# Patient Record
Sex: Male | Born: 1955 | Race: White | Hispanic: No | Marital: Married | Smoking: Current every day smoker
Health system: Southern US, Community
[De-identification: ages and names within clinical notes are randomized; demographics above are authoritative.]

## PROBLEM LIST (undated history)

## (undated) DIAGNOSIS — I10 Essential (primary) hypertension: Secondary | ICD-10-CM

## (undated) HISTORY — DX: Essential (primary) hypertension: I10

## (undated) HISTORY — PX: KNEE ARTHROCENTESIS: SUR44

## (undated) HISTORY — PX: SHOULDER SURGERY: SHX246

## (undated) HISTORY — PX: THROAT SURGERY: SHX803

---

## 2010-03-03 ENCOUNTER — Ambulatory Visit: Payer: Self-pay | Admitting: Family Medicine

## 2010-03-03 DIAGNOSIS — H8110 Benign paroxysmal vertigo, unspecified ear: Secondary | ICD-10-CM

## 2010-03-03 HISTORY — DX: Benign paroxysmal vertigo, unspecified ear: H81.10

## 2010-08-05 NOTE — Assessment & Plan Note (Signed)
Summary: DIZZY (4)   Vital Signs:  Patient Profile:   55 Years Old Male CC:      Dizzy x today Height:     73 inches Weight:      221 pounds O2 Sat:      99 % O2 treatment:    Room Air Temp:     98.5 degrees F oral Pulse rate:   58 / minute Pulse (ortho):   58 / minute Resp:     14 per minute BP sitting:   134 / 84  (left arm) BP standing:   139 / 88 Cuff size:   large  Pt. in pain?   no  Vitals Entered By: Lajean Saver RN (March 03, 2010 8:41 AM)                  Serial Vital Signs/Assessments:  Time      Position  BP       Pulse  Resp  Temp     By 8:54 AM   Lying RA  127/82   58                    Lajean Saver RN 8:54 AM   Sitting   131/86   56                    Lajean Saver RN 8:54 AM   Standing  139/88   58                    Lajean Saver RN   Updated Prior Medication List: No Medications Current Allergies: No known allergies History of Present Illness Chief Complaint: Dizzy x today History of Present Illness:  Subjective:  Patient complains of awakening about a week ago with mild dizziness that lasted all day, but resolved spontaneously.  Today he again awoke with mild dizziness that is worse with movement and when he is supine.  He describes a sensation of head spinning and feeling off balance.  No headache, vision changes, or other neuro symptoms.  No fevers, chills, and sweats.  No earache.  No recent respiratory symptoms.  No nausea/vomiting   REVIEW OF SYSTEMS Constitutional Symptoms      Denies fever, chills, night sweats, weight loss, weight gain, and fatigue.  Eyes       Denies change in vision, eye pain, eye discharge, glasses, contact lenses, and eye surgery. Ear/Nose/Throat/Mouth       Complains of dizziness.      Denies hearing loss/aids, change in hearing, ear pain, ear discharge, frequent runny nose, frequent nose bleeds, sinus problems, sore throat, hoarseness, and tooth pain or bleeding.  Respiratory       Denies dry cough,  productive cough, wheezing, shortness of breath, asthma, bronchitis, and emphysema/COPD.  Cardiovascular       Denies murmurs, chest pain, and tires easily with exhertion.    Gastrointestinal       Denies stomach pain, nausea/vomiting, diarrhea, constipation, blood in bowel movements, and indigestion. Genitourniary       Denies painful urination, kidney stones, and loss of urinary control. Neurological       Denies paralysis, seizures, and fainting/blackouts. Musculoskeletal       Denies muscle pain, joint pain, joint stiffness, decreased range of motion, redness, swelling, muscle weakness, and gout.  Skin       Denies bruising, unusual mles/lumps or sores, and hair/skin or nail changes.  Psych  Denies mood changes, temper/anger issues, anxiety/stress, speech problems, depression, and sleep problems. Other Comments: Patient c/o dizziness x today and once last week. He is dizzy while sitting still but it is more prominant when changing positions.   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  Extraocular movement is intact.  Conjunctivae are not inflamed.  Fundi benign.  There is very mild nystagmus on lateral gaze. Ears:  Canals normal.  Tympanic membranes normal.   Nose:  No sinus congestion or sinus tenderness Mouth:  Tongue midline Pharynx:  Normal  Neck:  Supple.  No adenopathy is present.  No thyromegaly is present.  Carotids normal without bruits Lungs:  Clear to auscultation.  Breath sounds are equal.  Heart:  Regular rate and rhythm without murmurs, rubs, or  gallops.  Abdomen:  Nontender without masses or hepatosplenomegaly.  Bowel sounds are present.  No CVA or flank tenderness.  Neurologic:  Cranial nerves 2 through 12 are normal.  Patellar, achilles, and elbow reflexes are normal.  Cerebellar function is intact.  Gait and station are normal.    Assessment New Problems: BENIGN POSITIONAL VERTIGO (ICD-386.11)   Plan New Medications/Changes: MECLIZINE HCL 25 MG TABS (MECLIZINE HCL) One by mouth two times a day to three times a day as needed for dizziness  #15 x 1, 03/03/2010, Donna Christen MD  New Orders: New Patient Level III [phone] Planning Comments:   Begin Antivert. Follow-up with PCP if not improving one week or if symptoms worsen.   The patient and/or caregiver has been counseled thoroughly with regard to medications prescribed including dosage, schedule, interactions, rationale for use, and possible side effects and they verbalize understanding.  Diagnoses and expected course of recovery discussed and will return if not improved as expected or if the condition worsens. Patient and/or caregiver verbalized understanding.  Prescriptions: MECLIZINE HCL 25 MG TABS (MECLIZINE HCL) One by mouth two times a day to three times a day as needed for dizziness  #15 x 1   Entered and Authorized by:   Donna Christen MD   Signed by:   Donna Christen MD on 03/03/2010   Method used:   Print then Give to Patient   RxID:   [id number]   Orders Added: 1)  New Patient Level III [95621]  Appended Document: DIZZY (4) F/U call to pt - Courtesy call mess left on VM.

## 2011-07-26 ENCOUNTER — Emergency Department: Admit: 2011-07-26 | Discharge: 2011-07-26 | Disposition: A | Discharge status: Home / Self Care | Payer: 59

## 2011-07-26 ENCOUNTER — Emergency Department
Admission: EM | Admit: 2011-07-26 | Discharge: 2011-07-26 | Disposition: A | Discharge status: Home / Self Care | Payer: 59 | Source: Home / Self Care | Attending: Emergency Medicine | Admitting: Emergency Medicine

## 2011-07-26 ENCOUNTER — Encounter: Payer: Self-pay | Admitting: Emergency Medicine

## 2011-07-26 DIAGNOSIS — J209 Acute bronchitis, unspecified: Secondary | ICD-10-CM

## 2011-07-26 DIAGNOSIS — R05 Cough: Secondary | ICD-10-CM

## 2011-07-26 MED ORDER — AZITHROMYCIN 250 MG PO TABS: ORAL_TABLET | ORAL | Status: AC

## 2011-07-26 MED ORDER — PREDNISONE (PAK) 10 MG PO TABS: 10.0000 mg | ORAL_TABLET | Freq: Every day | ORAL | Status: AC

## 2011-07-26 NOTE — ED Notes (Signed)
Coughing productive x 1 week. No Flu vaccination this season.

## 2011-07-26 NOTE — ED Provider Notes (Signed)
History     CSN: [id number]  Arrival date & time 07/26/11  1244   First MD Initiated Contact with Patient 07/26/11 1350      Chief Complaint  Patient presents with  . Cough    (Consider location/radiation/quality/duration/timing/severity/associated sxs/prior treatment) HPI Benjamin Melton is a 56 y.o. male who complains of onset of cold symptoms for 5-7 days. He has been spitting up some brown phlegm and his wife who is a nurse advised that he have a chest x-ray to rule out pneumonia. Other than the cough and chest tightness he states he feels completely fine. No sore throat + mild cough No pleuritic pain No wheezing No nasal congestion + post-nasal drainage No sinus pain/pressure No chest congestion No itchy/red eyes No earache No hemoptysis No SOB No chills/sweats No fever No nausea No vomiting No abdominal pain No diarrhea No skin rashes No fatigue No myalgias No headache    History reviewed. No pertinent past medical history.  Past Surgical History  Procedure Date  . Throat surgery   . Knee arthrocentesis     History reviewed. No pertinent family history.  History  Substance Use Topics  . Smoking status: Current Everyday Smoker  . Smokeless tobacco: Not on file  . Alcohol Use: No      Review of Systems  Allergies  Review of patient's allergies indicates no known allergies.  Home Medications  No current outpatient prescriptions on file.  BP 144/98  Pulse 66  Temp(Src) 98.9 F (37.2 C) (Oral)  Resp 18  Ht 6' 1.5" (1.867 m)  Wt 215 lb (97.523 kg)  BMI 27.98 kg/m2  SpO2 98%  Physical Exam  Nursing note and vitals reviewed. Constitutional: He is oriented to person, place, and time. He appears well-developed and well-nourished.  HENT:  Head: Normocephalic and atraumatic.  Right Ear: Tympanic membrane, external ear and ear canal normal.  Left Ear: Tympanic membrane, external ear and ear canal normal.  Nose: Nose normal.  Mouth/Throat: No  oropharyngeal exudate or posterior oropharyngeal edema.  Eyes: No scleral icterus.  Neck: Neck supple.  Cardiovascular: Regular rhythm and normal heart sounds.   Pulmonary/Chest: Effort normal. No respiratory distress. He has wheezes in the right lower field. He has rhonchi in the right lower field.  Neurological: He is alert and oriented to person, place, and time.  Skin: Skin is warm and dry.  Psychiatric: He has a normal mood and affect. His speech is normal.    ED Course  Procedures (including critical care time)  Labs Reviewed - No data to display Dg Chest 2 View  07/26/2011  *RADIOLOGY REPORT*  Clinical Data: Cough  CHEST - 2 VIEW  Comparison: None.  Findings: Mild scarring versus atelectasis in the right lower lobe. No pleural effusion or pneumothorax.  Cardiomediastinal silhouette is within normal limits.  Mild degenerative changes of the visualized thoracolumbar spine.  IMPRESSION: No evidence of acute cardiopulmonary disease.  Original Report Authenticated By: Charline Bills, M.D.     1. Cough   2. Acute bronchitis       MDM  1)  Take the prescribed antibiotic as instructed.  No more smoking! 2)  Use nasal saline solution (over the counter) at least 3 times a day. 3)  Use over the counter decongestants like Zyrtec-D every 12 hours as needed to help with congestion.  If you have hypertension, do not take medicines with sudafed.  4)  Can take tylenol every 6 hours or motrin every 8 hours for pain or  fever. 5)  Follow up with your primary doctor if no improvement in 5-7 days, sooner if increasing pain, fever, or new symptoms.     Lily Kocher, MD 07/26/11 [phone]

## 2011-07-27 ENCOUNTER — Emergency Department: Admission: EM | Admit: 2011-07-27 | Discharge: 2011-07-27 | Discharge status: Absent without leave | Payer: Self-pay

## 2012-09-03 IMAGING — CR DG CHEST 2V
2 series · 2 of 2 positions shown · non-contrast
Comparison: None.

CLINICAL DATA: Cough

CHEST - 2 VIEW

[view not recorded (1 of 2)]
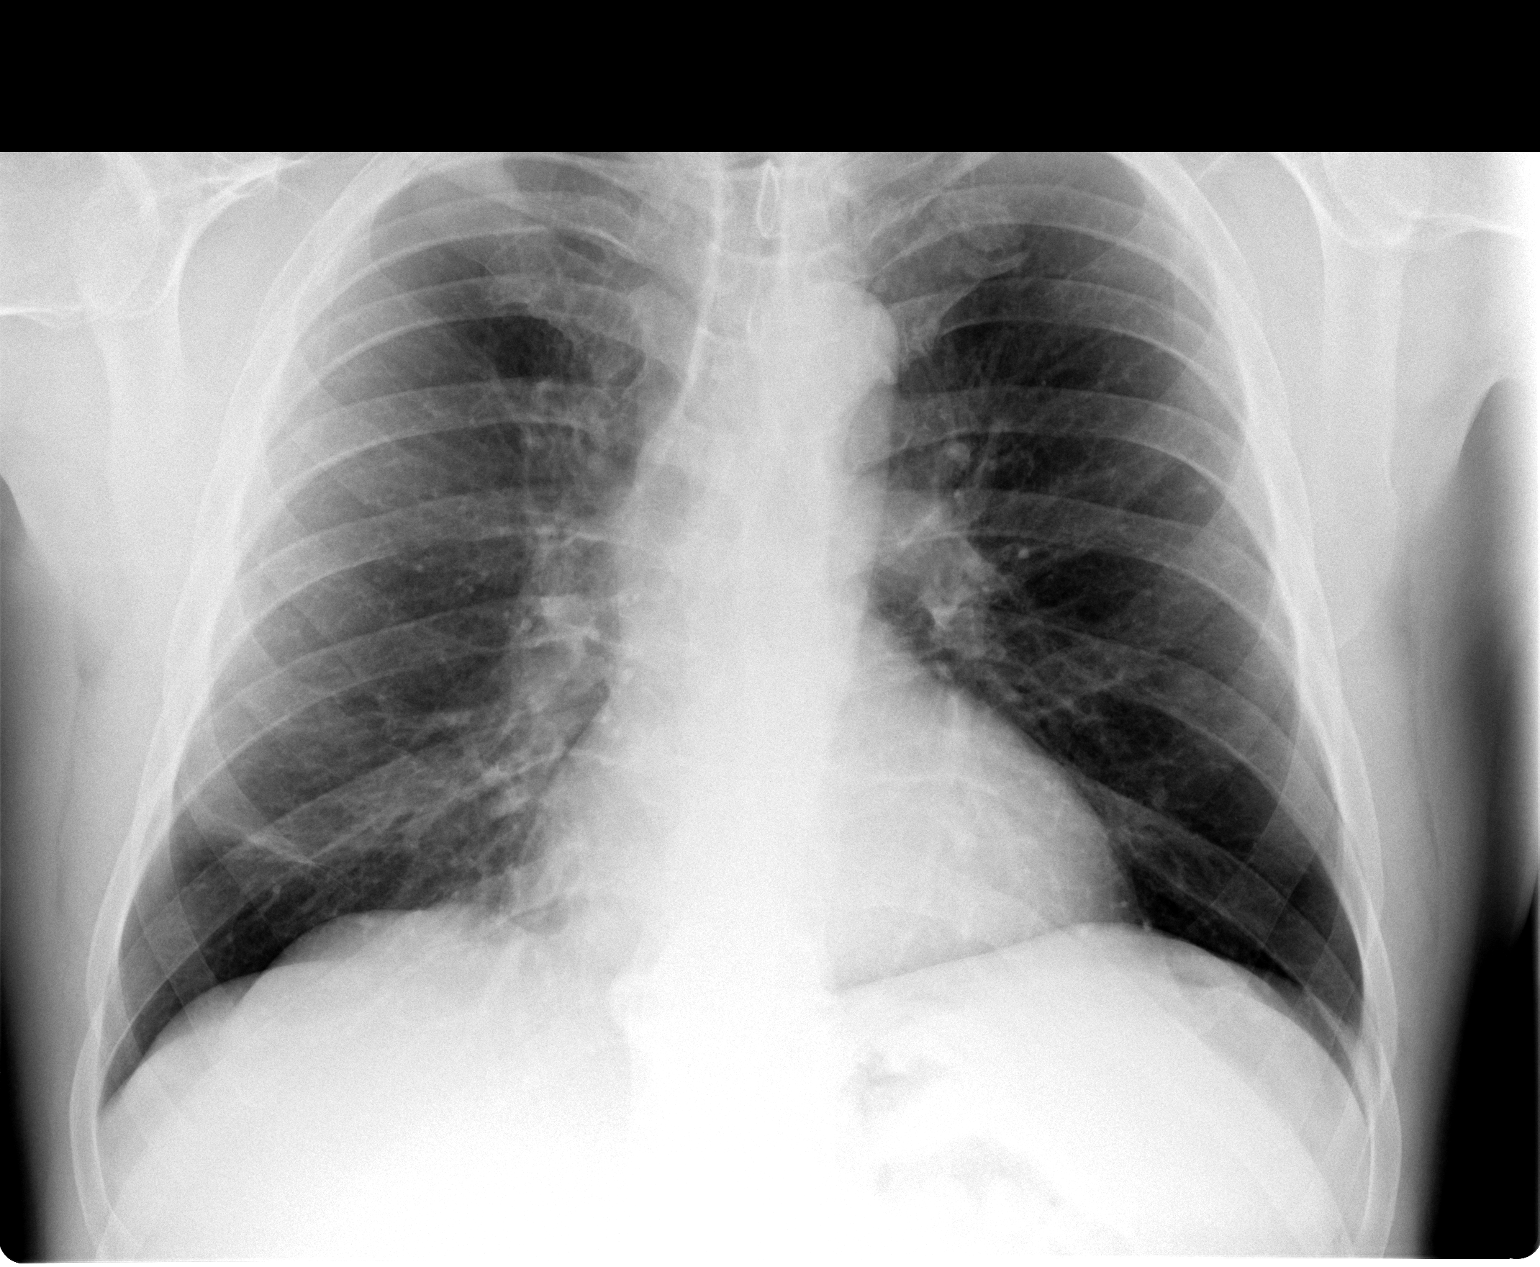

[view not recorded (2 of 2)]
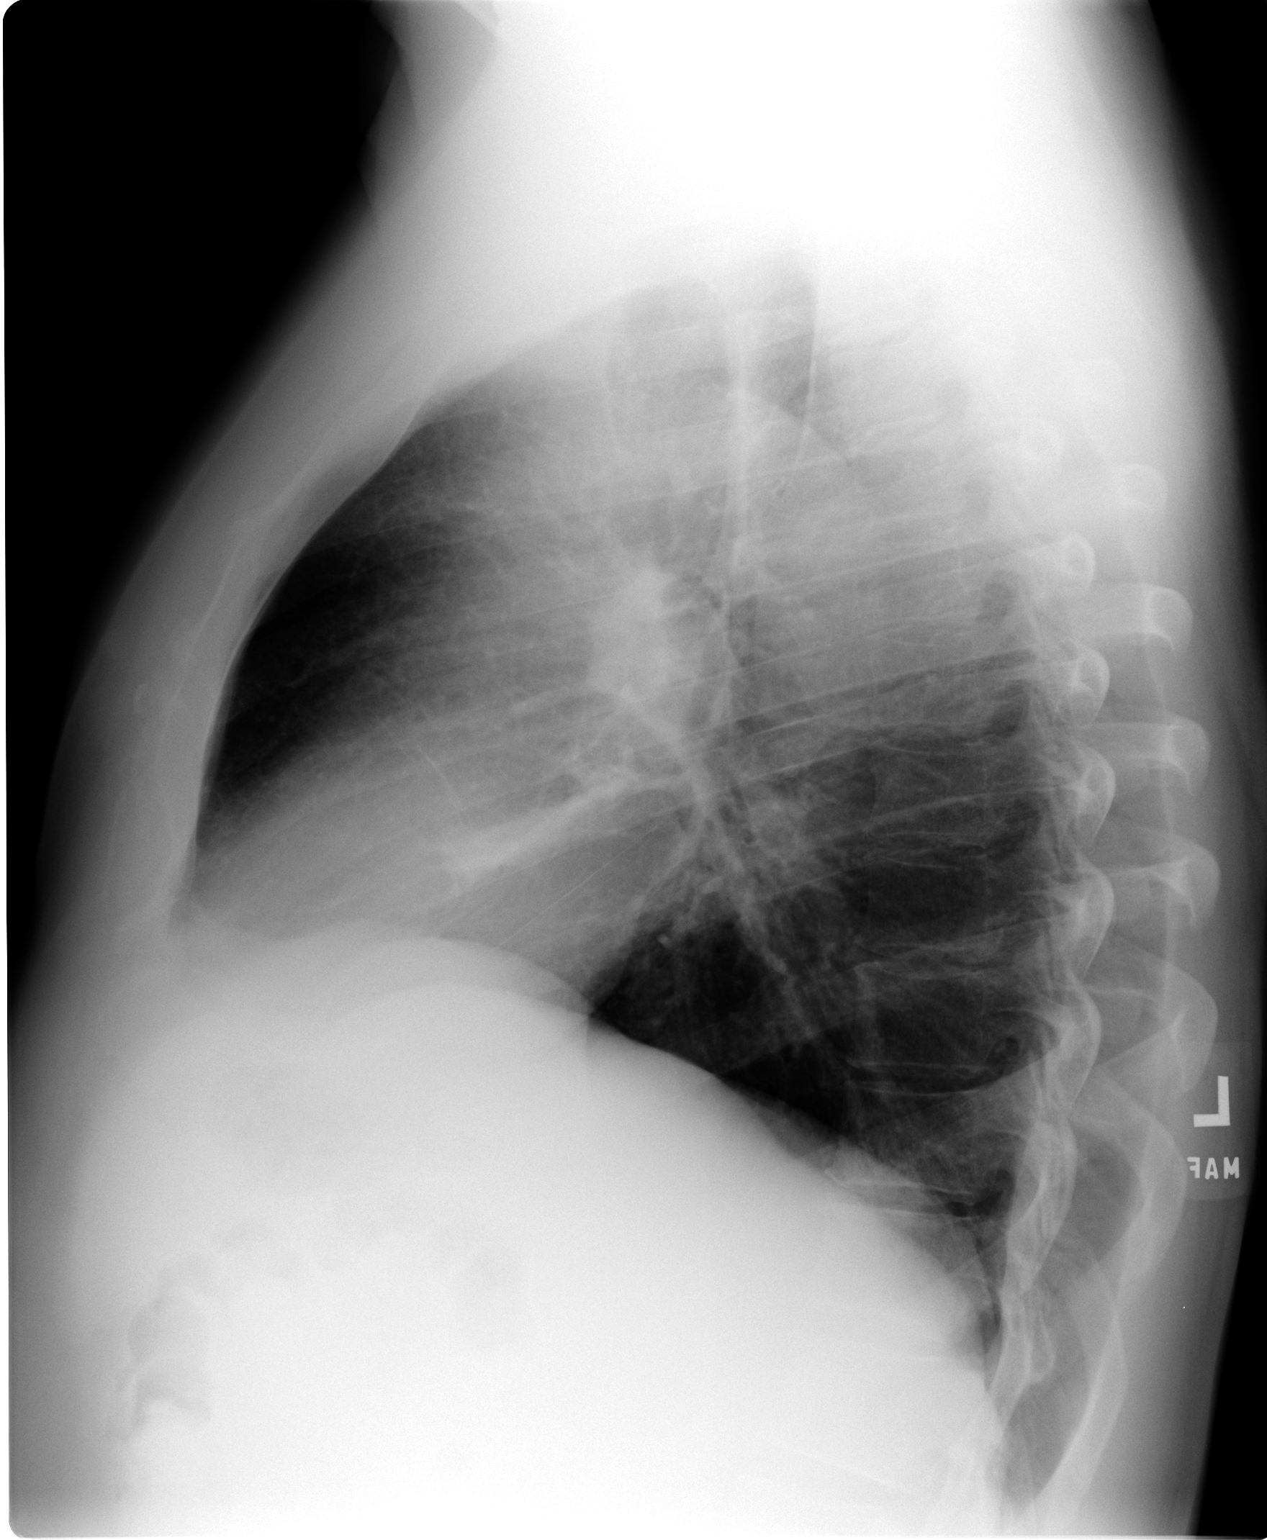

[2 of 2 positions shown; findings below may reference images not displayed]

FINDINGS: Mild scarring versus atelectasis in the right lower lobe.
No pleural effusion or pneumothorax.

Cardiomediastinal silhouette is within normal limits.

Mild degenerative changes of the visualized thoracolumbar spine.
IMPRESSION: No evidence of acute cardiopulmonary disease.

## 2018-10-26 DIAGNOSIS — S46012A Strain of muscle(s) and tendon(s) of the rotator cuff of left shoulder, initial encounter: Secondary | ICD-10-CM

## 2018-10-26 DIAGNOSIS — S46219A Strain of muscle, fascia and tendon of other parts of biceps, unspecified arm, initial encounter: Secondary | ICD-10-CM

## 2018-10-26 DIAGNOSIS — M7542 Impingement syndrome of left shoulder: Secondary | ICD-10-CM

## 2018-10-26 HISTORY — DX: Strain of muscle, fascia and tendon of other parts of biceps, unspecified arm, initial encounter: S46.219A

## 2018-10-26 HISTORY — DX: Strain of muscle(s) and tendon(s) of the rotator cuff of left shoulder, initial encounter: S46.012A

## 2018-10-26 HISTORY — DX: Impingement syndrome of left shoulder: M75.42

## 2018-12-28 DIAGNOSIS — Z4789 Encounter for other orthopedic aftercare: Secondary | ICD-10-CM

## 2018-12-28 HISTORY — DX: Encounter for other orthopedic aftercare: Z47.89

## 2019-07-10 ENCOUNTER — Emergency Department
Admission: EM | Admit: 2019-07-10 | Discharge: 2019-07-10 | Disposition: A | Discharge status: Home / Self Care | Payer: 59 | Source: Home / Self Care

## 2019-07-10 ENCOUNTER — Other Ambulatory Visit: Payer: Self-pay

## 2019-07-10 DIAGNOSIS — I440 Atrioventricular block, first degree: Secondary | ICD-10-CM

## 2019-07-10 DIAGNOSIS — I10 Essential (primary) hypertension: Secondary | ICD-10-CM

## 2019-07-10 DIAGNOSIS — R001 Bradycardia, unspecified: Secondary | ICD-10-CM

## 2019-07-10 MED ORDER — AMLODIPINE BESYLATE 5 MG PO TABS: 5.0000 mg | ORAL_TABLET | Freq: Every day | ORAL | 0 refills | Status: DC

## 2019-07-10 NOTE — ED Triage Notes (Signed)
pt was at an appointment this morning, and BP was high.  Is here for hyprtension

## 2019-07-10 NOTE — ED Provider Notes (Signed)
[facility]    CSN: [id number] Arrival date & time: 07/10/19  1305      History   Chief Complaint Chief Complaint  Patient presents with  . Hypertension    HPI Benjamin Melton is a 64 y.o. male.   HPI  Benjamin Melton presents for evaluation of elevated blood pressure readings. Patient reports elevated blood pressure readings dating back to March 2020. Patient is without PCP has not had any formal follow-up of anti-hypertensive therapy initiated. He is currently receiving physical therapy for a WC related injury. Today while attempting to receive a fitness to return to work evaluation, he was deferred here for follow-up of elevated blood pressure. He is a current everyday smoker and endorses a history of poor diet. Denies shortness of breath, chest pain, weakness, headache or palpitation. He endorses feeling off balance at time attributes that to vertigo. No changes in speech, facial or unilateral numbness, or syncope occurring previously. He reports the highest his blood pressure measured two weeks ago >200 systolic /100 diastolic while at physical therapy.  History reviewed. No pertinent past medical history.  Patient Active Problem List   Diagnosis Date Noted  . BENIGN POSITIONAL VERTIGO 03/03/2010    Past Surgical History:  Procedure Laterality Date  . KNEE ARTHROCENTESIS    . THROAT SURGERY        Home Medications    Prior to Admission medications   Not on File    Family History History reviewed. No pertinent family history.  Social History Social History   Tobacco Use  . Smoking status: Current Every Day Smoker  Substance Use Topics  . Alcohol use: No  . Drug use: No     Allergies   Patient has no known allergies.   Review of Systems Review of Systems Pertinent negatives listed in HPI  Physical Exam Triage Vital Signs ED Triage Vitals [07/10/19 1432]  Enc Vitals Group     BP (!) 160/91     Pulse Rate (!) 53     Resp 20     Temp  98.3 F (36.8 C)     Temp Source Oral     SpO2 98 %     Weight 202 lb (91.6 kg)     Height 6\' 1"  (1.854 m)     Head Circumference      Peak Flow      Pain Score 0     Pain Loc      Pain Edu?      Excl. in GC?    No data found.  Updated Vital Signs BP (!) 160/91 (BP Location: Right Arm)   Pulse (!) 53   Temp 98.3 F (36.8 C) (Oral)   Resp 20   Ht 6\' 1"  (1.854 m)   Wt 202 lb (91.6 kg)   SpO2 98%   BMI 26.65 kg/m   Visual Acuity Right Eye Distance:   Left Eye Distance:   Bilateral Distance:    Right Eye Near:   Left Eye Near:    Bilateral Near:     Physical Exam   UC Treatments / Results  Labs (all labs ordered are listed, but only abnormal results are displayed) Labs Reviewed - No data to display  EKG   Radiology No results found.  Procedures Procedures (including critical care time)  Medications Ordered in UC Medications - No data to display  Initial Impression / Assessment and Plan / UC Course  I have reviewed the triage vital signs and  the nursing notes.  Pertinent labs & imaging results that were available during my care of the patient were reviewed by me and considered in my medical decision making (see chart for details).   Accelerated hypertension, patient has had no follow-up with PCP regarding diagnosis.  Patient has no known diagnosis for most of 2020 and has failed to follow-up and establish with a primary care provider.  Patient is undergoing a Worker's Compensation claim and has been unable to pass the fitness to return to work and complete certain therapies due to systolic pressure consistently be greater than 160.  EKG findings today is concerning for bradycardia and 81 block patient has been explained in depth that it is important to follow-up with her cardiologist to ensure he does not have any underlying heart failure due to untreated hypertension.  I did agree to start him on Norvasc 5 mg once daily with a limited quantity of only 30 days  as it is prudent that he follows up with a primary care provider and undergo a cardiology evaluation to rule out the presence of  CAD or CHF. Patient has also been advised medication may likely require titration therefore blood pressure may remain not at goal with mono antihypertensive therapy however encouraged him to check his blood pressure daily and maintain a log of blood pressure reading.  Patient is insistent that he will lose his job if his blood pressure is not controlled within 72 hours however again reiterated that blood pressure medication affects individuals in different ways depending on the degree and severity of hypertensive disease.  Patient verbalized understanding and agreement to follow-up with her primary care provider. Final Clinical Impressions(s) / UC Diagnoses   Final diagnoses:  Essential hypertension  Sinus bradycardia  AV block, 1st degree     Discharge Instructions     Follow-up with cardiologist and establish with a Primary Care for evaluation of cardiovascular disease. You need a additional work-up to rule out any chronic changes with your heart. For blood pressure, I am starting you on amlodipine 5 mg once daily. Medication may cause dizziness, make positional changes slowly while taking medication.    ED Prescriptions    Medication Sig Dispense Auth. Provider   amLODipine (NORVASC) 5 MG tablet Take 1 tablet (5 mg total) by mouth daily. 30 tablet Scot Jun, FNP     PDMP not reviewed this encounter.   Scot Jun, FNP 07/12/19 1427

## 2019-07-10 NOTE — Discharge Instructions (Addendum)
Follow-up with cardiologist and establish with a Primary Care for evaluation of cardiovascular disease. You need a additional work-up to rule out any chronic changes with your heart. For blood pressure, I am starting you on amlodipine 5 mg once daily. Medication may cause dizziness, make positional changes slowly while taking medication.

## 2019-07-18 DIAGNOSIS — M25512 Pain in left shoulder: Secondary | ICD-10-CM

## 2019-07-18 HISTORY — DX: Pain in left shoulder: M25.512

## 2019-07-20 ENCOUNTER — Telehealth: Payer: Self-pay | Admitting: Cardiology

## 2019-07-20 NOTE — Telephone Encounter (Signed)
LVM for patient to call and schedule appt with Dr Jens Som after being seen in [facility] ED

## 2019-08-07 ENCOUNTER — Telehealth: Payer: Self-pay | Admitting: Family Medicine

## 2019-08-07 MED ORDER — AMLODIPINE BESYLATE 5 MG PO TABS: 5.0000 mg | ORAL_TABLET | Freq: Every day | ORAL | 0 refills | Status: DC

## 2019-08-07 NOTE — Telephone Encounter (Signed)
Agreed to refill amlodipine x 7 day.

## 2019-08-11 ENCOUNTER — Other Ambulatory Visit: Payer: Self-pay

## 2019-08-13 NOTE — Progress Notes (Signed)
Cardiology Office Note:    Date:  08/14/2019   ID:  [name], DOB 10-28-1955, MRN [id number]  PCP:  Patient, No Pcp Per  Cardiologist:  Norman Herrlich, MD   Referring MD: No ref. provider found  ASSESSMENT:    1. Essential hypertension   2. Sinus bradycardia   3. First degree AV block   4. Disequilibrium   5. Lipid screening   6. Palpitations    PLAN:    In order of problems listed above:  1. Hypertension remains poorly controlled add second agent ARB continue calcium channel blocker has to be careful with sodium intake purchase a digital blood pressure cuff and record daily with tips about accurately recorded blood pressure.  Will check renal function potassium and aldosterone to renal level.  I do not think he requires a renal vascular duplex unless a third agent is needed.  Goal systolic 130 diastolic 80 or less.  He may require repeat sleep study and declines at this time due to financial concerns 2. Improved I did not repeat an EKG today 3. By itself a benign finding on EKG 4. Ongoing symptoms occurred in the setting of hypertensive urgency CT of head was negative symptoms persist to consider doing an MRI and declines today due to cost. 5. This has resolved and if recurrent will need an ambulatory heart rhythm monitor  Next appointment 2 to 3 weeks   Medication Adjustments/Labs and Tests Ordered: Current medicines are reviewed at length with the patient today.  Concerns regarding medicines are outlined above.  Orders Placed This Encounter  Procedures  . COMPLETE METABOLIC PANEL WITH GFR  . Lipid Profile  . TSH  . Aldosterone + renin activity w/ ratio   Meds ordered this encounter  Medications  . losartan (COZAAR) 50 MG tablet    Sig: Take 1 tablet (50 mg total) by mouth daily.    Dispense:  90 tablet    Refill:  1     Chief Complaint  Patient presents with  . Follow-up  . Hypertension    History of Present Illness:    Benjamin Melton is a 64 y.o. male  who is being seen today for the treatment of hypertension at the request of Bing Neighbors, FNP He was seen at the [facility] urgent care 07/10/2019 with elevated blood pressure reports of blood pressure greater than 200 systolic greater than 100 diastolic while at physical therapy. Urgent care blood pressure documented his blood pressure at 160/91.  With sinus bradycardia he was initiated on antihypertensive therapy with amlodipine.  I independently reviewed the physician note from the nurse practitioner from that visit.  His background history is noteworthy for obstructive sleep apnea and had ENT surgery with marked improvement.  Recently his wife tells him that he snores and he declines a sleep study at this time.  When diagnosed with hypertension he is aware of his heart beating and that has resolved.  He is not checking blood pressure at home.  He also adds salt to his diet.  Today his blood pressure remains elevated repeat 150/80 MR had a second agent and ARB we will check labs today including renal function potassium and bring him back to the office in 2 few weeks to reassess.  Since that time he has been unsteady he has had a CT of his head done with no evidence of stroke we discussed doing an MRI and he declines due to concerns of insurance and payment.  If the  symptoms persist he may benefit from either ENT evaluation or MRI with his accelerated hypertension prior to treatment.  Is not having headache chest pain shortness of breath or syncope.  He relates a history of heart murmur as a young man has nothing on physical examination today.  Did not repeat an EKG with a resting heart rate of 66 bpm.  Had no side effect from the calcium channel blocker no orthostatic lightheadedness and no edema.  Past Medical History:  Diagnosis Date  . BP (high blood pressure)     Past Surgical History:  Procedure Laterality Date  . KNEE ARTHROCENTESIS    . SHOULDER SURGERY    . THROAT SURGERY       Current Medications: Current Meds  Medication Sig  . Acetaminophen (TYLENOL) 325 MG CAPS Tylenol  . amLODipine (NORVASC) 5 MG tablet Take 1 tablet (5 mg total) by mouth daily.     Allergies:   Patient has no known allergies.   Social History   Socioeconomic History  . Marital status: Married    Spouse name: Not on file  . Number of children: Not on file  . Years of education: Not on file  . Highest education level: Not on file  Occupational History  . Not on file  Tobacco Use  . Smoking status: Current Every Day Smoker    Types: Cigarettes  . Smokeless tobacco: Never Used  Substance and Sexual Activity  . Alcohol use: No  . Drug use: No  . Sexual activity: Yes  Other Topics Concern  . Not on file  Social History Narrative  . Not on file   Social Determinants of Health   Financial Resource Strain:   . Difficulty of Paying Living Expenses: Not on file  Food Insecurity:   . Worried About Charity fundraiser in the Last Year: Not on file  . Ran Out of Food in the Last Year: Not on file  Transportation Needs:   . Lack of Transportation (Medical): Not on file  . Lack of Transportation (Non-Medical): Not on file  Physical Activity:   . Days of Exercise per Week: Not on file  . Minutes of Exercise per Session: Not on file  Stress:   . Feeling of Stress : Not on file  Social Connections:   . Frequency of Communication with Friends and Family: Not on file  . Frequency of Social Gatherings with Friends and Family: Not on file  . Attends Religious Services: Not on file  . Active Member of Clubs or Organizations: Not on file  . Attends Archivist Meetings: Not on file  . Marital Status: Not on file     Family History: The patient's family history includes Alzheimer's disease in his mother; Heart attack in his father and maternal grandfather; Heart disease in his maternal grandfather.  ROS:   Review of Systems  Constitution: Negative.  HENT: Negative.    Eyes: Negative.   Cardiovascular: Positive for palpitations.  Respiratory: Negative.   Endocrine: Negative.   Hematologic/Lymphatic: Negative.   Skin: Negative.   Musculoskeletal: Positive for joint pain.  Gastrointestinal: Negative.   Genitourinary: Negative.   Neurological: Positive for disturbances in coordination and loss of balance.  Psychiatric/Behavioral: Negative.   Allergic/Immunologic: Negative.    Please see the history of present illness.     All other systems reviewed and are negative.  EKGs/Labs/Other Studies Reviewed:    The following studies were reviewed today:  EKG . 07/10/2019 dependently reviewed showed  sinus bradycardia 49 bpm first-degree AV block otherwise normal.  CT of head performed 11/07/2018 for vertigo is no acute intracranial abnormality.  Recent Labs: None, last labs are from June 2017 in Care Everywhere CBC normal hemoglobin 16.6 CMP is normal potassium 5.1 creatinine 0.84 GFR 97 cc/min normal liver function test  Physical Exam:    VS:  BP (!) 144/78   Pulse 66   Temp (!) 97.2 F (36.2 C)   Ht 6\' 1"  (1.854 m)   Wt 209 lb 1.9 oz (94.9 kg)   SpO2 99%   BMI 27.59 kg/m     Wt Readings from Last 3 Encounters:  08/14/19 209 lb 1.9 oz (94.9 kg)  07/10/19 202 lb (91.6 kg)  07/26/11 215 lb (97.5 kg)     GEN:  Well nourished, well developed in no acute distress HEENT: Normal NECK: No JVD; No carotid bruits LYMPHATICS: No lymphadenopathy CARDIAC: RRR, no murmurs, rubs, gallops RESPIRATORY:  Clear to auscultation without rales, wheezing or rhonchi  ABDOMEN: Soft, non-tender, non-distended MUSCULOSKELETAL:  No edema; No deformity  SKIN: Warm and dry NEUROLOGIC:  Alert and oriented x 3 PSYCHIATRIC:  Normal affect     Signed, 07/28/11, MD  08/14/2019 9:24 AM    [facility]

## 2019-08-14 ENCOUNTER — Ambulatory Visit (INDEPENDENT_AMBULATORY_CARE_PROVIDER_SITE_OTHER): Payer: 59 | Admitting: Cardiology

## 2019-08-14 ENCOUNTER — Other Ambulatory Visit: Payer: Self-pay

## 2019-08-14 ENCOUNTER — Encounter: Payer: Self-pay | Admitting: Cardiology

## 2019-08-14 VITALS — BP 144/78 | HR 66 | Temp 97.2°F | Ht 73.0 in | Wt 209.1 lb

## 2019-08-14 DIAGNOSIS — R001 Bradycardia, unspecified: Secondary | ICD-10-CM

## 2019-08-14 DIAGNOSIS — Z13220 Encounter for screening for lipoid disorders: Secondary | ICD-10-CM

## 2019-08-14 DIAGNOSIS — R42 Dizziness and giddiness: Secondary | ICD-10-CM

## 2019-08-14 DIAGNOSIS — R002 Palpitations: Secondary | ICD-10-CM

## 2019-08-14 DIAGNOSIS — I10 Essential (primary) hypertension: Secondary | ICD-10-CM

## 2019-08-14 DIAGNOSIS — I440 Atrioventricular block, first degree: Secondary | ICD-10-CM

## 2019-08-14 MED ORDER — LOSARTAN POTASSIUM 50 MG PO TABS: 50.0000 mg | ORAL_TABLET | Freq: Every day | ORAL | 1 refills | Status: DC

## 2019-08-14 NOTE — Patient Instructions (Addendum)
Medication Instructions:  Your physician has recommended you make the following change in your medication:   START: Losartan 50 mg TAke 1 tab daily  *If you need a refill on your cardiac medications before your next appointment, please call your pharmacy*  Lab Work: Your physician recommends that you return for lab work in: TODAY CMP,lipids,TSH,aldosterone/renin  If you have labs (blood work) drawn today and your tests are completely normal, you will receive your results only by: Marland Kitchen MyChart Message (if you have MyChart) OR . A paper copy in the mail If you have any lab test that is abnormal or we need to change your treatment, we will call you to review the results.  Testing/Procedures: NOne  Follow-Up: At [facility], you and your health needs are our priority.  As part of our continuing mission to provide you with exceptional heart care, we have created designated Provider Care Teams.  These Care Teams include your primary Cardiologist (physician) and Advanced Practice Providers (APPs -  Physician Assistants and Nurse Practitioners) who all work together to provide you with the care you need, when you need it.  Your next appointment:   2-3 week(s)  The format for your next appointment:   In Person  Provider:   Norman Herrlich, MD or Dr Thomasene Ripple  Other Instructions         [facility]  Tips to measure your blood pressure correctly  To determine whether you have hypertension, a medical professional will take a blood pressure reading. How you prepare for the test, the position of your arm, and other factors can change a blood pressure reading by 10% or more. That could be enough to hide high blood pressure, start you on a drug you don't really need, or lead your doctor to incorrectly adjust your medications. National and international guidelines offer specific instructions for measuring blood pressure. If a doctor, nurse, or medical assistant isn't doing it right, don't  hesitate to ask him or her to get with the guidelines. Here's what you can do to ensure a correct reading: . Don't drink a caffeinated beverage or smoke during the 30 minutes before the test. . Sit quietly for five minutes before the test begins. . During the measurement, sit in a chair with your feet on the floor and your arm supported so your elbow is at about heart level. . The inflatable part of the cuff should completely cover at least 80% of your upper arm, and the cuff should be placed on bare skin, not over a shirt. . Don't talk during the measurement. . Have your blood pressure measured twice, with a brief break in between. If the readings are different by 5 points or more, have it done a third time. There are times to break these rules. If you sometimes feel lightheaded when getting out of bed in the morning or when you stand after sitting, you should have your blood pressure checked while seated and then while standing to see if it falls from one position to the next. Because blood pressure varies throughout the day, your doctor will rarely diagnose hypertension on the basis of a single reading. Instead, he or she will want to confirm the measurements on at least two occasions, usually within a few weeks of one another. The exception to this rule is if you have a blood pressure reading of 180/110 mm Hg or higher. A result this high usually calls for prompt treatment. It's also a good idea to have your  blood pressure measured in both arms at least once, since the reading in one arm (usually the right) may be higher than that in the left. A 2014 study in The American Journal of Medicine of nearly 3,400 people found average arm- to-arm differences in systolic blood pressure of about 5 points. The higher number should be used to make treatment decisions. In 2017, new guidelines from the American Heart Association, the [employer] of [employer], and nine other health organizations lowered the  diagnosis of high blood pressure to 130/80 mm Hg or higher for all adults. The guidelines also redefined the various blood pressure categories to now include normal, elevated, Stage 1 hypertension, Stage 2 hypertension, and hypertensive crisis (see "Blood pressure categories"). Blood pressure categories  Blood pressure category SYSTOLIC (upper number)  DIASTOLIC (lower number)  Normal Less than 120 mm Hg and Less than 80 mm Hg  Elevated 120-129 mm Hg and Less than 80 mm Hg  High blood pressure: Stage 1 hypertension 130-139 mm Hg or 80-89 mm Hg  High blood pressure: Stage 2 hypertension 140 mm Hg or higher or 90 mm Hg or higher  Hypertensive crisis (consult your doctor immediately) Higher than 180 mm Hg and/or Higher than 120 mm Hg  Source: American Heart Association and American Stroke Association. For more on getting your blood pressure under control, buy Controlling Your Blood Pressure, a Special Health Report from [facility].DASH diet: Healthy eating to lower your blood pressure The DASH diet emphasizes portion size, eating a variety of foods and getting the right amount of nutrients. Discover how DASH can improve your health and lower your blood pressure. By [facility] Staff  DASH stands for Dietary Approaches to Stop Hypertension. The DASH diet is a lifelong approach to healthy eating that's designed to help treat or prevent high blood pressure (hypertension). The DASH diet encourages you to reduce the sodium in your diet and eat a variety of foods rich in nutrients that help lower blood pressure, such as potassium, calcium and magnesium. By following the DASH diet, you may be able to reduce your blood pressure by a few points in just two weeks. Over time, your systolic blood pressure could drop by eight to 14 points, which can make a significant difference in your health risks. Because the DASH diet is a healthy way of eating, it offers health benefits besides just lowering blood  pressure. The DASH diet is also in line with dietary recommendations to prevent osteoporosis, cancer, heart disease, stroke and diabetes. DASH diet: Sodium levels The DASH diet emphasizes vegetables, fruits and low-fat dairy foods -- and moderate amounts of whole grains, fish, poultry and nuts. In addition to the standard DASH diet, there is also a lower sodium version of the diet. You can choose the version of the diet that meets your health needs: Standard DASH diet. You can consume up to 2,300 milligrams (mg) of sodium a day.  Lower sodium DASH diet. You can consume up to 1,500 mg of sodium a day. Both versions of the DASH diet aim to reduce the amount of sodium in your diet compared with what you might get in a typical American diet, which can amount to a whopping 3,400 mg of sodium a day or more. The standard DASH diet meets the recommendation from the Dietary Guidelines for Americans to keep daily sodium intake to less than 2,300 mg a day. The American Heart Association recommends 1,500 mg a day of sodium as an upper limit for  all adults. If you aren't sure what sodium level is right for you, talk to your doctor. DASH diet: What to eat Both versions of the DASH diet include lots of whole grains, fruits, vegetables and low-fat dairy products. The DASH diet also includes some fish, poultry and legumes, and encourages a small amount of nuts and seeds a few times a week.  You can eat red meat, sweets and fats in small amounts. The DASH diet is low in saturated fat, cholesterol and total fat. Here's a look at the recommended servings from each food group for the 2,000-calorie-a-day DASH diet. Grains: 6 to 8 servings a day Grains include bread, cereal, rice and pasta. Examples of one serving of grains include 1 slice whole-wheat bread, 1 ounce dry cereal, or 1/2 cup cooked cereal, rice or pasta. Focus on whole grains because they have more fiber and nutrients than do refined grains. For instance, use  brown rice instead of white rice, whole-wheat pasta instead of regular pasta and whole-grain bread instead of white bread. Look for products labeled "100 percent whole grain" or "100 percent whole wheat."  Grains are naturally low in fat. Keep them this way by avoiding butter, cream and cheese sauces. Vegetables: 4 to 5 servings a day Tomatoes, carrots, broccoli, sweet potatoes, greens and other vegetables are full of fiber, vitamins, and such minerals as potassium and magnesium. Examples of one serving include 1 cup raw leafy green vegetables or 1/2 cup cut-up raw or cooked vegetables. Don't think of vegetables only as side dishes -- a hearty blend of vegetables served over brown rice or whole-wheat noodles can serve as the main dish for a meal.  Fresh and frozen vegetables are both good choices. When buying frozen and canned vegetables, choose those labeled as low sodium or without added salt.  To increase the number of servings you fit in daily, be creative. In a stir-fry, for instance, cut the amount of meat in half and double up on the vegetables. Fruits: 4 to 5 servings a day Many fruits need little preparation to become a healthy part of a meal or snack. Like vegetables, they're packed with fiber, potassium and magnesium and are typically low in fat -- coconuts are an exception. Examples of one serving include one medium fruit, 1/2 cup fresh, frozen or canned fruit, or 4 ounces of juice. Have a piece of fruit with meals and one as a snack, then round out your day with a dessert of fresh fruits topped with a dollop of low-fat yogurt.  Leave on edible peels whenever possible. The peels of apples, pears and most fruits with pits add interesting texture to recipes and contain healthy nutrients and fiber.  Remember that citrus fruits and juices, such as grapefruit, can interact with certain medications, so check with your doctor or pharmacist to see if they're OK for you.  If you choose canned fruit or  juice, make sure no sugar is added. Dairy: 2 to 3 servings a day Milk, yogurt, cheese and other dairy products are major sources of calcium, vitamin D and protein. But the key is to make sure that you choose dairy products that are low fat or fat-free because otherwise they can be a major source of fat -- and most of it is saturated. Examples of one serving include 1 cup skim or 1 percent milk, 1 cup low fat yogurt, or 1 1/2 ounces part-skim cheese. Low-fat or fat-free frozen yogurt can help you boost the amount of dairy  products you eat while offering a sweet treat. Add fruit for a healthy twist.  If you have trouble digesting dairy products, choose lactose-free products or consider taking an over-the-counter product that contains the enzyme lactase, which can reduce or prevent the symptoms of lactose intolerance.  Go easy on regular and even fat-free cheeses because they are typically high in sodium. Lean meat, poultry and fish: 6 servings or fewer a day Meat can be a rich source of protein, B vitamins, iron and zinc. Choose lean varieties and aim for no more than 6 ounces a day. Cutting back on your meat portion will allow room for more vegetables. Trim away skin and fat from poultry and meat and then bake, broil, grill or roast instead of frying in fat.  Eat heart-healthy fish, such as salmon, herring and tuna. These types of fish are high in omega-3 fatty acids, which can help lower your total cholesterol. Nuts, seeds and legumes: 4 to 5 servings a week Almonds, sunflower seeds, kidney beans, peas, lentils and other foods in this family are good sources of magnesium, potassium and protein. They're also full of fiber and phytochemicals, which are plant compounds that may protect against some cancers and cardiovascular disease. Serving sizes are small and are intended to be consumed only a few times a week because these foods are high in calories. Examples of one serving include 1/3 cup nuts, 2  tablespoons seeds, or 1/2 cup cooked beans or peas.  Nuts sometimes get a bad rap because of their fat content, but they contain healthy types of fat -- monounsaturated fat and omega-3 fatty acids. They're high in calories, however, so eat them in moderation. Try adding them to stir-fries, salads or cereals.  Soybean-based products, such as tofu and tempeh, can be a good alternative to meat because they contain all of the amino acids your body needs to make a complete protein, just like meat. Fats and oils: 2 to 3 servings a day Fat helps your body absorb essential vitamins and helps your body's immune system. But too much fat increases your risk of heart disease, diabetes and obesity. The DASH diet strives for a healthy balance by limiting total fat to less than 30 percent of daily calories from fat, with a focus on the healthier monounsaturated fats. Examples of one serving include 1 teaspoon soft margarine, 1 tablespoon mayonnaise or 2 tablespoons salad dressing. Saturated fat and trans fat are the main dietary culprits in increasing your risk of coronary artery disease. DASH helps keep your daily saturated fat to less than 6 percent of your total calories by limiting use of meat, butter, cheese, whole milk, cream and eggs in your diet, along with foods made from lard, solid shortenings, and palm and coconut oils.  Avoid trans fat, commonly found in such processed foods as crackers, baked goods and fried items.  Read food labels on margarine and salad dressing so that you can choose those that are lowest in saturated fat and free of trans fat. Sweets: 5 servings or fewer a week You don't have to banish sweets entirely while following the DASH diet -- just go easy on them. Examples of one serving include 1 tablespoon sugar, jelly or jam, 1/2 cup sorbet, or 1 cup lemonade. When you eat sweets, choose those that are fat-free or low-fat, such as sorbets, fruit ices, jelly beans, hard candy, graham crackers  or low-fat cookies.  Artificial sweeteners such as aspartame (NutraSweet, Equal) and sucralose (Splenda) may help satisfy  your sweet tooth while sparing the sugar. But remember that you still must use them sensibly. It's OK to swap a diet cola for a regular cola, but not in place of a more nutritious beverage such as low-fat milk or even plain water.  Cut back on added sugar, which has no nutritional value but can pack on calories. DASH diet: Alcohol and caffeine Drinking too much alcohol can increase blood pressure. The Dietary Guidelines for Americans recommends that men limit alcohol to no more than two drinks a day and women to one or less. The DASH diet doesn't address caffeine consumption. The influence of caffeine on blood pressure remains unclear. But caffeine can cause your blood pressure to rise at least temporarily. If you already have high blood pressure or if you think caffeine is affecting your blood pressure, talk to your doctor about your caffeine consumption. DASH diet and weight loss While the DASH diet is not a weight-loss program, you may indeed lose unwanted pounds because it can help guide you toward healthier food choices. The DASH diet generally includes about 2,000 calories a day. If you're trying to lose weight, you may need to eat fewer calories. You may also need to adjust your serving goals based on your individual circumstances -- something your health care team can help you decide. Tips to cut back on sodium The foods at the core of the DASH diet are naturally low in sodium. So just by following the DASH diet, you're likely to reduce your sodium intake. You also reduce sodium further by: Using sodium-free spices or flavorings with your food instead of salt  Not adding salt when cooking rice, pasta or hot cereal  Rinsing canned foods to remove some of the sodium  Buying foods labeled "no salt added," "sodium-free," "low sodium" or "very low sodium" One teaspoon of table  salt has 2,325 mg of sodium. When you read food labels, you may be surprised at just how much sodium some processed foods contain. Even low-fat soups, canned vegetables, ready-to-eat cereals and sliced [place] from the local deli -- foods you may have considered healthy -- often have lots of sodium. You may notice a difference in taste when you choose low-sodium food and beverages. If things seem too bland, gradually introduce low-sodium foods and cut back on table salt until you reach your sodium goal. That'll give your palate time to adjust. Using salt-free seasoning blends or herbs and spices may also ease the transition. It can take several weeks for your taste buds to get used to less salty foods. Putting the pieces of the DASH diet together Try these strategies to get started on the DASH diet:  Change gradually. If you now eat only one or two servings of fruits or vegetables a day, try to add a serving at lunch and one at dinner. Rather than switching to all whole grains, start by making one or two of your grain servings whole grains. Increasing fruits, vegetables and whole grains gradually can also help prevent bloating or diarrhea that may occur if you aren't used to eating a diet with lots of fiber. You can also try over-the-counter products to help reduce gas from beans and vegetables.  Reward successes and forgive slip-ups. Reward yourself with a nonfood treat for your accomplishments -- rent a movie, purchase a book or get together with a friend. Everyone slips, especially when learning something new. Remember that changing your lifestyle is a long-term process. Find out what triggered your setback and  then just pick up where you left off with the DASH diet.  Add physical activity. To boost your blood pressure lowering efforts even more, consider increasing your physical activity in addition to following the DASH diet. Combining both the DASH diet and physical activity makes it more likely that  you'll reduce your blood pressure.  Get support if you need it. If you're having trouble sticking to your diet, talk to your doctor or dietitian about it. You might get some tips that will help you stick to the DASH diet. Remember, healthy eating isn't an all-or-nothing proposition. What's most important is that, on average, you eat healthier foods with plenty of variety -- both to keep your diet nutritious and to avoid boredom or extremes. And with the DASH diet, you can have both.

## 2019-08-17 ENCOUNTER — Other Ambulatory Visit: Payer: Self-pay | Admitting: *Deleted

## 2019-08-17 ENCOUNTER — Telehealth: Payer: Self-pay | Admitting: Cardiology

## 2019-08-17 MED ORDER — AMLODIPINE BESYLATE 5 MG PO TABS: 5.0000 mg | ORAL_TABLET | Freq: Every day | ORAL | 11 refills | Status: DC

## 2019-08-17 NOTE — Telephone Encounter (Signed)
Pt c/o medication issue:  1. Name of Medication: methylPREDNISolone (MEDROL DOSEPAK) 4 MG TBPK tablet  2. How are you currently taking this medication (dosage and times per day)? N/A  3. Are you having a reaction (difficulty breathing--STAT)? no  4. What is your medication issue? Patient saw this medication on his medication list. Just wants a nurse confirmation that the does not need to take this medication.

## 2019-08-17 NOTE — Telephone Encounter (Signed)
Telephone call to patient. Informed him he is not to be taking the medrol . It was a 1 time med given by another provider.

## 2019-08-20 LAB — COMPREHENSIVE METABOLIC PANEL
ALT: 18 IU/L (ref 0–44)
AST: 16 IU/L (ref 0–40)
Albumin/Globulin Ratio: 2 (ref 1.2–2.2)
Albumin: 4.3 g/dL (ref 3.8–4.8)
Alkaline Phosphatase: 78 IU/L (ref 39–117)
BUN/Creatinine Ratio: 13 (ref 10–24)
BUN: 12 mg/dL (ref 8–27)
Bilirubin Total: 0.4 mg/dL (ref 0.0–1.2)
CO2: 23 mmol/L (ref 20–29)
Calcium: 9.3 mg/dL (ref 8.6–10.2)
Chloride: 106 mmol/L (ref 96–106)
Creatinine, Ser: 0.89 mg/dL (ref 0.76–1.27)
GFR calc Af Amer: 107 mL/min/{1.73_m2} (ref >59)
GFR calc non Af Amer: 92 mL/min/{1.73_m2} (ref >59)
Globulin, Total: 2.1 g/dL (ref 1.5–4.5)
Glucose: 89 mg/dL (ref 65–99)
Potassium: 3.9 mmol/L (ref 3.5–5.2)
Sodium: 143 mmol/L (ref 134–144)
Total Protein: 6.4 g/dL (ref 6.0–8.5)

## 2019-08-20 LAB — LIPID PANEL
Chol/HDL Ratio: 4.5 ratio (ref 0.0–5.0)
Cholesterol, Total: 187 mg/dL (ref 100–199)
HDL: 42 mg/dL (ref >39)
LDL Chol Calc (NIH): 102 mg/dL — ABNORMAL HIGH (ref 0–99)
Triglycerides: 254 mg/dL — ABNORMAL HIGH (ref 0–149)
VLDL Cholesterol Cal: 43 mg/dL — ABNORMAL HIGH (ref 5–40)

## 2019-08-20 LAB — TSH: TSH: 0.975 u[IU]/mL (ref 0.450–4.500)

## 2019-08-20 LAB — ALDOSTERONE + RENIN ACTIVITY W/ RATIO
ALDOS/RENIN RATIO: 10.5 (ref 0.0–30.0)
ALDOSTERONE: 5.1 ng/dL (ref 0.0–30.0)
Renin: 0.486 ng/mL/hr (ref 0.167–5.380)

## 2019-08-25 ENCOUNTER — Encounter: Payer: Self-pay | Admitting: *Deleted

## 2019-09-09 NOTE — Progress Notes (Signed)
Cardiology Office Note:    Date:  09/11/2019   ID:  [name], DOB December 29, 1955, MRN [id number]  PCP:  Patient, No Pcp Per  Cardiologist:  Norman Herrlich, MD    Referring MD: No ref. provider found    ASSESSMENT:    1. Essential hypertension   2. Sinus bradycardia    PLAN:    In order of problems listed above:  1. Improved BP at target continue combination calcium channel blocker low potency ARB encouraged him to check blood pressures at home and record I'll plan to see in the office in 6 months recheck renal function today at risk for hypokalemia and renal insufficiency with ARB.  With a normal aldosterone level I don't think he would benefit from MRA 2. Stable and asymptomatic avoid beta-blockers and rate limiting calcium channel blocker   Next appointment: 6 months   Medication Adjustments/Labs and Tests Ordered: Current medicines are reviewed at length with the patient today.  Concerns regarding medicines are outlined above.  No orders of the defined types were placed in this encounter.  No orders of the defined types were placed in this encounter.   Chief Complaint  Patient presents with  . Follow-up  . Hypertension    History of Present Illness:    Benjamin Melton is a 64 y.o. male with a hx of hypertension last seen 08/14/2019.He was seen at the [facility] urgent care 07/10/2019 with elevated blood pressure reports of blood pressure greater than 200 systolic greater than 100 diastolic while at physical therapy. Urgent care blood pressure documented his blood pressure at 160/91.  With sinus bradycardia he was initiated on antihypertensive therapy with amlodipine and an ARB was added at his last visit.  He did have outpatient readings of greater than 200 greater than 100 systolic and diastolic prior to initiation of antihypertensive treatment.   EKG did not show findings of left ventricular hypertrophy  His background history is noteworthy for  obstructive sleep apnea and had ENT surgery   He has normal aldosterone renal and and ratio findings.  Also normal renal function potassium last visit Ref Range & Units 3 wk ago   ALDOSTERONE 0.0 - 30.0 ng/dL 5.1   Comment: This test was developed and its performance characteristics  determined by [facility]. It has not been cleared or approved  by the Food and Drug Administration.   Renin 0.167 - 5.380 ng/mL/hr 0.486   Comment: This test was developed and its performance characteristics  determined by [facility]. It has not been cleared or approved  by the Food and Drug Administration.   ALDOS/RENIN RATIO 0.0 - 30.0 10.5   Comment:             Units:   ng/dL per ng/mL/hr   Compliance with diet, lifestyle and medications: Yes  Unfortunately not checking home blood pressure repeat by me in the office 132/80 BP at target on appropriate treatment.  He finds himself fatigued during the day wonders if it is related medications and will switch him to bedtime to mitigate symptoms and also to enhance effectiveness.  I asked him to purchase a blood pressure cuff and check twice a week and will recheck renal function potassium after ARB.  No lightheadedness palpitation chest pain or syncope. Past Medical History:  Diagnosis Date  . BP (high blood pressure)     Past Surgical History:  Procedure Laterality Date  . KNEE ARTHROCENTESIS    . SHOULDER SURGERY    . THROAT SURGERY  Current Medications: Current Meds  Medication Sig  . Acetaminophen (TYLENOL) 325 MG CAPS Tylenol  . amLODipine (NORVASC) 5 MG tablet Take 1 tablet (5 mg total) by mouth daily.  Marland Kitchen losartan (COZAAR) 50 MG tablet Take 1 tablet (50 mg total) by mouth daily.     Allergies:   Patient has no known allergies.   Social History   Socioeconomic History  . Marital status: Married    Spouse name: Not on file  . Number of children: Not on file  . Years of education: Not on file  . Highest education level: Not  on file  Occupational History  . Not on file  Tobacco Use  . Smoking status: Current Every Day Smoker    Types: Cigarettes  . Smokeless tobacco: Never Used  Substance and Sexual Activity  . Alcohol use: No  . Drug use: No  . Sexual activity: Yes  Other Topics Concern  . Not on file  Social History Narrative  . Not on file   Social Determinants of Health   Financial Resource Strain:   . Difficulty of Paying Living Expenses: Not on file  Food Insecurity:   . Worried About Charity fundraiser in the Last Year: Not on file  . Ran Out of Food in the Last Year: Not on file  Transportation Needs:   . Lack of Transportation (Medical): Not on file  . Lack of Transportation (Non-Medical): Not on file  Physical Activity:   . Days of Exercise per Week: Not on file  . Minutes of Exercise per Session: Not on file  Stress:   . Feeling of Stress : Not on file  Social Connections:   . Frequency of Communication with Friends and Family: Not on file  . Frequency of Social Gatherings with Friends and Family: Not on file  . Attends Religious Services: Not on file  . Active Member of Clubs or Organizations: Not on file  . Attends Archivist Meetings: Not on file  . Marital Status: Not on file     Family History: The patient's family history includes Alzheimer's disease in his mother; Heart attack in his father and maternal grandfather; Heart disease in his maternal grandfather. ROS:   Please see the history of present illness.    All other systems reviewed and are negative.  EKGs/Labs/Other Studies Reviewed:    The following studies were reviewed today:   Recent Labs: 08/14/2019: ALT 18; BUN 12; Creatinine, Ser 0.89; Potassium 3.9; Sodium 143; TSH 0.975  Recent Lipid Panel    Component Value Date/Time   CHOL 187 08/14/2019 0931   TRIG 254 (H) 08/14/2019 0931   HDL 42 08/14/2019 0931   CHOLHDL 4.5 08/14/2019 0931   LDLCALC 102 (H) 08/14/2019 0931    Physical Exam:     VS:  BP 122/82   Pulse (!) 53   Temp (!) 97.3 F (36.3 C)   Ht 6\' 1"  (1.854 m)   Wt 212 lb (96.2 kg)   SpO2 96%   BMI 27.97 kg/m     Wt Readings from Last 3 Encounters:  09/11/19 212 lb (96.2 kg)  08/14/19 209 lb 1.9 oz (94.9 kg)  07/10/19 202 lb (91.6 kg)    Repeat blood pressure by me 132/80 right upper extremity  GEN:  Well nourished, well developed in no acute distress HEENT: Normal NECK: No JVD; No carotid bruits LYMPHATICS: No lymphadenopathy CARDIAC: RRR, no murmurs, rubs, gallops RESPIRATORY:  Clear to auscultation without rales, wheezing  or rhonchi  ABDOMEN: Soft, non-tender, non-distended MUSCULOSKELETAL:  No edema; No deformity  SKIN: Warm and dry NEUROLOGIC:  Alert and oriented x 3 PSYCHIATRIC:  Normal affect    Signed, Norman Herrlich, MD  09/11/2019 10:27 AM    [facility]

## 2019-09-11 ENCOUNTER — Other Ambulatory Visit: Payer: Self-pay

## 2019-09-11 ENCOUNTER — Encounter: Payer: Self-pay | Admitting: Cardiology

## 2019-09-11 ENCOUNTER — Ambulatory Visit (INDEPENDENT_AMBULATORY_CARE_PROVIDER_SITE_OTHER): Payer: Self-pay | Admitting: Cardiology

## 2019-09-11 VITALS — BP 122/82 | HR 53 | Temp 97.3°F | Ht 73.0 in | Wt 212.0 lb

## 2019-09-11 DIAGNOSIS — R001 Bradycardia, unspecified: Secondary | ICD-10-CM

## 2019-09-11 DIAGNOSIS — I10 Essential (primary) hypertension: Secondary | ICD-10-CM

## 2019-09-11 MED ORDER — LOSARTAN POTASSIUM 50 MG PO TABS: 50.0000 mg | ORAL_TABLET | Freq: Every evening | ORAL | 3 refills | Status: DC

## 2019-09-11 MED ORDER — AMLODIPINE BESYLATE 5 MG PO TABS: 5.0000 mg | ORAL_TABLET | Freq: Every evening | ORAL | 3 refills | Status: DC

## 2019-09-11 NOTE — Patient Instructions (Addendum)
Medication Instructions:  Your physician has recommended you make the following change in your medication: Start taking your blood pressure medications in the evenings *If you need a refill on your cardiac medications before your next appointment, please call your pharmacy*   Lab Work: Your physician recommends that you have BMET today.  If you have labs (blood work) drawn today and your tests are completely normal, you will receive your results only by: Marland Kitchen MyChart Message (if you have MyChart) OR . A paper copy in the mail If you have any lab test that is abnormal or we need to change your treatment, we will call you to review the results.  Follow-Up: At [facility], you and your health needs are our priority.  As part of our continuing mission to provide you with exceptional heart care, we have created designated Provider Care Teams.  These Care Teams include your primary Cardiologist (physician) and Advanced Practice Providers (APPs -  Physician Assistants and Nurse Practitioners) who all work together to provide you with the care you need, when you need it.  We recommend signing up for the patient portal called "MyChart".  Sign up information is provided on this After Visit Summary.  MyChart is used to connect with patients for Virtual Visits (Telemedicine).  Patients are able to view lab/test results, encounter notes, upcoming appointments, etc.  Non-urgent messages can be sent to your provider as well.   To learn more about what you can do with MyChart, go to [email or web address].    Your next appointment:   6 month(s)  The format for your next appointment:   In Person  Provider:   You may see Norman Herrlich, MD or the following Advanced Practice Provider on your designated Care Team:    Gillian Shields, FNP     [facility]  Tips to measure your blood pressure correctly  To determine whether you have hypertension, a medical professional will take a blood pressure reading.  How you prepare for the test, the position of your arm, and other factors can change a blood pressure reading by 10% or more. That could be enough to hide high blood pressure, start you on a drug you don't really need, or lead your doctor to incorrectly adjust your medications. National and international guidelines offer specific instructions for measuring blood pressure. If a doctor, nurse, or medical assistant isn't doing it right, don't hesitate to ask him or her to get with the guidelines. Here's what you can do to ensure a correct reading: . Don't drink a caffeinated beverage or smoke during the 30 minutes before the test. . Sit quietly for five minutes before the test begins. . During the measurement, sit in a chair with your feet on the floor and your arm supported so your elbow is at about heart level. . The inflatable part of the cuff should completely cover at least 80% of your upper arm, and the cuff should be placed on bare skin, not over a shirt. . Don't talk during the measurement. . Have your blood pressure measured twice, with a brief break in between. If the readings are different by 5 points or more, have it done a third time. There are times to break these rules. If you sometimes feel lightheaded when getting out of bed in the morning or when you stand after sitting, you should have your blood pressure checked while seated and then while standing to see if it falls from one position to the next. Because blood pressure  varies throughout the day, your doctor will rarely diagnose hypertension on the basis of a single reading. Instead, he or she will want to confirm the measurements on at least two occasions, usually within a few weeks of one another. The exception to this rule is if you have a blood pressure reading of 180/110 mm Hg or higher. A result this high usually calls for prompt treatment. It's also a good idea to have your blood pressure measured in both arms at least once, since  the reading in one arm (usually the right) may be higher than that in the left. A 2014 study in The American Journal of Medicine of nearly 3,400 people found average arm- to-arm differences in systolic blood pressure of about 5 points. The higher number should be used to make treatment decisions. In 2017, new guidelines from the American Heart Association, the [employer] of [employer], and nine other health organizations lowered the diagnosis of high blood pressure to 130/80 mm Hg or higher for all adults. The guidelines also redefined the various blood pressure categories to now include normal, elevated, Stage 1 hypertension, Stage 2 hypertension, and hypertensive crisis (see "Blood pressure categories"). Blood pressure categories  Blood pressure category SYSTOLIC (upper number)  DIASTOLIC (lower number)  Normal Less than 120 mm Hg and Less than 80 mm Hg  Elevated 120-129 mm Hg and Less than 80 mm Hg  High blood pressure: Stage 1 hypertension 130-139 mm Hg or 80-89 mm Hg  High blood pressure: Stage 2 hypertension 140 mm Hg or higher or 90 mm Hg or higher  Hypertensive crisis (consult your doctor immediately) Higher than 180 mm Hg and/or Higher than 120 mm Hg  Source: American Heart Association and American Stroke Association. For more on getting your blood pressure under control, buy Controlling Your Blood Pressure, a Special Health Report from [facility].DASH diet: Healthy eating to lower your blood pressure The DASH diet emphasizes portion size, eating a variety of foods and getting the right amount of nutrients. Discover how DASH can improve your health and lower your blood pressure. By [facility] Staff  DASH stands for Dietary Approaches to Stop Hypertension. The DASH diet is a lifelong approach to healthy eating that's designed to help treat or prevent high blood pressure (hypertension). The DASH diet encourages you to reduce the sodium in your diet and eat a variety of foods  rich in nutrients that help lower blood pressure, such as potassium, calcium and magnesium. By following the DASH diet, you may be able to reduce your blood pressure by a few points in just two weeks. Over time, your systolic blood pressure could drop by eight to 14 points, which can make a significant difference in your health risks. Because the DASH diet is a healthy way of eating, it offers health benefits besides just lowering blood pressure. The DASH diet is also in line with dietary recommendations to prevent osteoporosis, cancer, heart disease, stroke and diabetes. DASH diet: Sodium levels The DASH diet emphasizes vegetables, fruits and low-fat dairy foods -- and moderate amounts of whole grains, fish, poultry and nuts. In addition to the standard DASH diet, there is also a lower sodium version of the diet. You can choose the version of the diet that meets your health needs: Standard DASH diet. You can consume up to 2,300 milligrams (mg) of sodium a day.  Lower sodium DASH diet. You can consume up to 1,500 mg of sodium a day. Both versions of the DASH diet  aim to reduce the amount of sodium in your diet compared with what you might get in a typical American diet, which can amount to a whopping 3,400 mg of sodium a day or more. The standard DASH diet meets the recommendation from the Dietary Guidelines for Americans to keep daily sodium intake to less than 2,300 mg a day. The American Heart Association recommends 1,500 mg a day of sodium as an upper limit for all adults. If you aren't sure what sodium level is right for you, talk to your doctor. DASH diet: What to eat Both versions of the DASH diet include lots of whole grains, fruits, vegetables and low-fat dairy products. The DASH diet also includes some fish, poultry and legumes, and encourages a small amount of nuts and seeds a few times a week.  You can eat red meat, sweets and fats in small amounts. The DASH diet is low in saturated fat,  cholesterol and total fat. Here's a look at the recommended servings from each food group for the 2,000-calorie-a-day DASH diet. Grains: 6 to 8 servings a day Grains include bread, cereal, rice and pasta. Examples of one serving of grains include 1 slice whole-wheat bread, 1 ounce dry cereal, or 1/2 cup cooked cereal, rice or pasta. Focus on whole grains because they have more fiber and nutrients than do refined grains. For instance, use brown rice instead of white rice, whole-wheat pasta instead of regular pasta and whole-grain bread instead of white bread. Look for products labeled "100 percent whole grain" or "100 percent whole wheat."  Grains are naturally low in fat. Keep them this way by avoiding butter, cream and cheese sauces. Vegetables: 4 to 5 servings a day Tomatoes, carrots, broccoli, sweet potatoes, greens and other vegetables are full of fiber, vitamins, and such minerals as potassium and magnesium. Examples of one serving include 1 cup raw leafy green vegetables or 1/2 cup cut-up raw or cooked vegetables. Don't think of vegetables only as side dishes -- a hearty blend of vegetables served over brown rice or whole-wheat noodles can serve as the main dish for a meal.  Fresh and frozen vegetables are both good choices. When buying frozen and canned vegetables, choose those labeled as low sodium or without added salt.  To increase the number of servings you fit in daily, be creative. In a stir-fry, for instance, cut the amount of meat in half and double up on the vegetables. Fruits: 4 to 5 servings a day Many fruits need little preparation to become a healthy part of a meal or snack. Like vegetables, they're packed with fiber, potassium and magnesium and are typically low in fat -- coconuts are an exception. Examples of one serving include one medium fruit, 1/2 cup fresh, frozen or canned fruit, or 4 ounces of juice. Have a piece of fruit with meals and one as a snack, then round out your day  with a dessert of fresh fruits topped with a dollop of low-fat yogurt.  Leave on edible peels whenever possible. The peels of apples, pears and most fruits with pits add interesting texture to recipes and contain healthy nutrients and fiber.  Remember that citrus fruits and juices, such as grapefruit, can interact with certain medications, so check with your doctor or pharmacist to see if they're OK for you.  If you choose canned fruit or juice, make sure no sugar is added. Dairy: 2 to 3 servings a day Milk, yogurt, cheese and other dairy products are major sources of  calcium, vitamin D and protein. But the key is to make sure that you choose dairy products that are low fat or fat-free because otherwise they can be a major source of fat -- and most of it is saturated. Examples of one serving include 1 cup skim or 1 percent milk, 1 cup low fat yogurt, or 1 1/2 ounces part-skim cheese. Low-fat or fat-free frozen yogurt can help you boost the amount of dairy products you eat while offering a sweet treat. Add fruit for a healthy twist.  If you have trouble digesting dairy products, choose lactose-free products or consider taking an over-the-counter product that contains the enzyme lactase, which can reduce or prevent the symptoms of lactose intolerance.  Go easy on regular and even fat-free cheeses because they are typically high in sodium. Lean meat, poultry and fish: 6 servings or fewer a day Meat can be a rich source of protein, B vitamins, iron and zinc. Choose lean varieties and aim for no more than 6 ounces a day. Cutting back on your meat portion will allow room for more vegetables. Trim away skin and fat from poultry and meat and then bake, broil, grill or roast instead of frying in fat.  Eat heart-healthy fish, such as salmon, herring and tuna. These types of fish are high in omega-3 fatty acids, which can help lower your total cholesterol. Nuts, seeds and legumes: 4 to 5 servings a week Almonds,  sunflower seeds, kidney beans, peas, lentils and other foods in this family are good sources of magnesium, potassium and protein. They're also full of fiber and phytochemicals, which are plant compounds that may protect against some cancers and cardiovascular disease. Serving sizes are small and are intended to be consumed only a few times a week because these foods are high in calories. Examples of one serving include 1/3 cup nuts, 2 tablespoons seeds, or 1/2 cup cooked beans or peas.  Nuts sometimes get a bad rap because of their fat content, but they contain healthy types of fat -- monounsaturated fat and omega-3 fatty acids. They're high in calories, however, so eat them in moderation. Try adding them to stir-fries, salads or cereals.  Soybean-based products, such as tofu and tempeh, can be a good alternative to meat because they contain all of the amino acids your body needs to make a complete protein, just like meat. Fats and oils: 2 to 3 servings a day Fat helps your body absorb essential vitamins and helps your body's immune system. But too much fat increases your risk of heart disease, diabetes and obesity. The DASH diet strives for a healthy balance by limiting total fat to less than 30 percent of daily calories from fat, with a focus on the healthier monounsaturated fats. Examples of one serving include 1 teaspoon soft margarine, 1 tablespoon mayonnaise or 2 tablespoons salad dressing. Saturated fat and trans fat are the main dietary culprits in increasing your risk of coronary artery disease. DASH helps keep your daily saturated fat to less than 6 percent of your total calories by limiting use of meat, butter, cheese, whole milk, cream and eggs in your diet, along with foods made from lard, solid shortenings, and palm and coconut oils.  Avoid trans fat, commonly found in such processed foods as crackers, baked goods and fried items.  Read food labels on margarine and salad dressing so that you  can choose those that are lowest in saturated fat and free of trans fat. Sweets: 5 servings or fewer a  week You don't have to banish sweets entirely while following the DASH diet -- just go easy on them. Examples of one serving include 1 tablespoon sugar, jelly or jam, 1/2 cup sorbet, or 1 cup lemonade. When you eat sweets, choose those that are fat-free or low-fat, such as sorbets, fruit ices, jelly beans, hard candy, graham crackers or low-fat cookies.  Artificial sweeteners such as aspartame (NutraSweet, Equal) and sucralose (Splenda) may help satisfy your sweet tooth while sparing the sugar. But remember that you still must use them sensibly. It's OK to swap a diet cola for a regular cola, but not in place of a more nutritious beverage such as low-fat milk or even plain water.  Cut back on added sugar, which has no nutritional value but can pack on calories. DASH diet: Alcohol and caffeine Drinking too much alcohol can increase blood pressure. The Dietary Guidelines for Americans recommends that men limit alcohol to no more than two drinks a day and women to one or less. The DASH diet doesn't address caffeine consumption. The influence of caffeine on blood pressure remains unclear. But caffeine can cause your blood pressure to rise at least temporarily. If you already have high blood pressure or if you think caffeine is affecting your blood pressure, talk to your doctor about your caffeine consumption. DASH diet and weight loss While the DASH diet is not a weight-loss program, you may indeed lose unwanted pounds because it can help guide you toward healthier food choices. The DASH diet generally includes about 2,000 calories a day. If you're trying to lose weight, you may need to eat fewer calories. You may also need to adjust your serving goals based on your individual circumstances -- something your health care team can help you decide. Tips to cut back on sodium The foods at the core of the DASH  diet are naturally low in sodium. So just by following the DASH diet, you're likely to reduce your sodium intake. You also reduce sodium further by: Using sodium-free spices or flavorings with your food instead of salt  Not adding salt when cooking rice, pasta or hot cereal  Rinsing canned foods to remove some of the sodium  Buying foods labeled "no salt added," "sodium-free," "low sodium" or "very low sodium" One teaspoon of table salt has 2,325 mg of sodium. When you read food labels, you may be surprised at just how much sodium some processed foods contain. Even low-fat soups, canned vegetables, ready-to-eat cereals and sliced [place] from the local deli -- foods you may have considered healthy -- often have lots of sodium. You may notice a difference in taste when you choose low-sodium food and beverages. If things seem too bland, gradually introduce low-sodium foods and cut back on table salt until you reach your sodium goal. That'll give your palate time to adjust. Using salt-free seasoning blends or herbs and spices may also ease the transition. It can take several weeks for your taste buds to get used to less salty foods. Putting the pieces of the DASH diet together Try these strategies to get started on the DASH diet:  Change gradually. If you now eat only one or two servings of fruits or vegetables a day, try to add a serving at lunch and one at dinner. Rather than switching to all whole grains, start by making one or two of your grain servings whole grains. Increasing fruits, vegetables and whole grains gradually can also help prevent bloating or diarrhea that may occur if  you aren't used to eating a diet with lots of fiber. You can also try over-the-counter products to help reduce gas from beans and vegetables.  Reward successes and forgive slip-ups. Reward yourself with a nonfood treat for your accomplishments -- rent a movie, purchase a book or get together with a friend. Everyone slips,  especially when learning something new. Remember that changing your lifestyle is a long-term process. Find out what triggered your setback and then just pick up where you left off with the DASH diet.  Add physical activity. To boost your blood pressure lowering efforts even more, consider increasing your physical activity in addition to following the DASH diet. Combining both the DASH diet and physical activity makes it more likely that you'll reduce your blood pressure.  Get support if you need it. If you're having trouble sticking to your diet, talk to your doctor or dietitian about it. You might get some tips that will help you stick to the DASH diet. Remember, healthy eating isn't an all-or-nothing proposition. What's most important is that, on average, you eat healthier foods with plenty of variety -- both to keep your diet nutritious and to avoid boredom or extremes. And with the DASH diet, you can have both.

## 2019-09-12 LAB — BASIC METABOLIC PANEL
BUN/Creatinine Ratio: 13 (ref 10–24)
BUN: 12 mg/dL (ref 8–27)
CO2: 22 mmol/L (ref 20–29)
Calcium: 9.4 mg/dL (ref 8.6–10.2)
Chloride: 105 mmol/L (ref 96–106)
Creatinine, Ser: 0.95 mg/dL (ref 0.76–1.27)
GFR calc Af Amer: 99 mL/min/{1.73_m2} (ref >59)
GFR calc non Af Amer: 86 mL/min/{1.73_m2} (ref >59)
Glucose: 88 mg/dL (ref 65–99)
Potassium: 4.2 mmol/L (ref 3.5–5.2)
Sodium: 141 mmol/L (ref 134–144)

## 2020-03-19 ENCOUNTER — Ambulatory Visit: Payer: Self-pay | Admitting: Cardiology

## 2020-04-03 ENCOUNTER — Ambulatory Visit: Payer: Self-pay | Admitting: Cardiology

## 2020-05-16 DIAGNOSIS — I10 Essential (primary) hypertension: Secondary | ICD-10-CM | POA: Insufficient documentation

## 2020-05-19 NOTE — Progress Notes (Signed)
Cardiology Office Note:    Date:  05/20/2020   ID:  Benjamin Melton, DOB [birth year], MRN [id number]  PCP:  Patient, No Pcp Per  Cardiologist:  Norman Herrlich, MD    Referring MD: No ref. provider found    ASSESSMENT:    1. Essential hypertension   2. Sinus bradycardia   3. Obstructive sleep apnea syndrome    PLAN:    In order of problems listed above:  1. Improved not at target I have asked him to start to monitor and record home blood pressures.  Continue ARB transition to thiazide diuretic and continue his calcium channel blocker.  2 weeks recheck renal function potassium with diuretic 2. Continue avoid beta-blockers   Next appointment: 6 months   Medication Adjustments/Labs and Tests Ordered: Current medicines are reviewed at length with the patient today.  Concerns regarding medicines are outlined above.  Orders Placed This Encounter  Procedures  . Basic metabolic panel   Meds ordered this encounter  Medications  . losartan-hydrochlorothiazide (HYZAAR) 50-12.5 MG tablet    Sig: Take 1 tablet by mouth daily.    Dispense:  90 tablet    Refill:  3    Chief Complaint  Patient presents with  . Follow-up  . Hypertension    History of Present Illness:    Benjamin Melton is a 64 y.o. male with a hx of sleep apnea with previous ENT surgery and and ongoing snoring and poorly controlled hypertension last seen 08/14/2019.  His aldosterone renal level was normal as was potassium 3.9 creatinine 0.89 and GFR 92 cc/min. Compliance with diet, lifestyle and medications: Yes  most recent blood pressures 08/14/2019 144/78 09/11/2019 122/82.  Unfortunately not checking blood pressure at home repeat by me in the office 144/88.  To optimize therapy add a low-dose of thiazide diuretic to his ARB.  No chest pain edema palpitation or syncope.  He continues to smoke Past Medical History:  Diagnosis Date  . BENIGN POSITIONAL VERTIGO 03/03/2010   Qualifier: Diagnosis of  By: Cathren Harsh MD,  Jeannett Senior    . BP (high blood pressure)   . Encounter for orthopedic follow-up care 12/28/2018  . Impingement syndrome of left shoulder region 10/26/2018  . Pain in joint of left shoulder 07/18/2019  . Strain of muscle(s) and tendon(s) of the rotator cuff of left shoulder, initial encounter 10/26/2018  . Traumatic rupture of biceps tendon 10/26/2018    Past Surgical History:  Procedure Laterality Date  . KNEE ARTHROCENTESIS    . SHOULDER SURGERY    . THROAT SURGERY      Current Medications: Current Meds  Medication Sig  . Acetaminophen (TYLENOL) 325 MG CAPS Tylenol  . amLODipine (NORVASC) 5 MG tablet Take 1 tablet (5 mg total) by mouth every evening.  Marland Kitchen losartan (COZAAR) 50 MG tablet Take 1 tablet (50 mg total) by mouth every evening.     Allergies:   Patient has no known allergies.   Social History   Socioeconomic History  . Marital status: Married    Spouse name: Not on file  . Number of children: Not on file  . Years of education: Not on file  . Highest education level: Not on file  Occupational History  . Not on file  Tobacco Use  . Smoking status: Current Every Day Smoker    Types: Cigarettes  . Smokeless tobacco: Never Used  Vaping Use  . Vaping Use: Never used  Substance and Sexual Activity  . Alcohol use: No  . Drug  use: No  . Sexual activity: Yes  Other Topics Concern  . Not on file  Social History Narrative  . Not on file   Social Determinants of Health   Financial Resource Strain:   . Difficulty of Paying Living Expenses: Not on file  Food Insecurity:   . Worried About Programme researcher, broadcasting/film/video in the Last Year: Not on file  . Ran Out of Food in the Last Year: Not on file  Transportation Needs:   . Lack of Transportation (Medical): Not on file  . Lack of Transportation (Non-Medical): Not on file  Physical Activity:   . Days of Exercise per Week: Not on file  . Minutes of Exercise per Session: Not on file  Stress:   . Feeling of Stress : Not on file    Social Connections:   . Frequency of Communication with Friends and Family: Not on file  . Frequency of Social Gatherings with Friends and Family: Not on file  . Attends Religious Services: Not on file  . Active Member of Clubs or Organizations: Not on file  . Attends Banker Meetings: Not on file  . Marital Status: Not on file     Family History: The patient's family history includes Alzheimer's disease in his mother; Heart attack in his father and maternal grandfather; Heart disease in his maternal grandfather. ROS:   Please see the history of present illness.    All other systems reviewed and are negative.  EKGs/Labs/Other Studies Reviewed:    The following studies were reviewed today:    Recent Labs: 08/14/2019: ALT 18; TSH 0.975 09/11/2019: BUN 12; Creatinine, Ser 0.95; Potassium 4.2; Sodium 141  Recent Lipid Panel    Component Value Date/Time   CHOL 187 08/14/2019 0931   TRIG 254 (H) 08/14/2019 0931   HDL 42 08/14/2019 0931   CHOLHDL 4.5 08/14/2019 0931   LDLCALC 102 (H) 08/14/2019 0931    Physical Exam:    VS:  BP (!) 142/70   Pulse 60   Ht 6\' 2"  (1.88 m)   Wt 205 lb (93 kg)   SpO2 98%   BMI 26.32 kg/m     Wt Readings from Last 3 Encounters:  05/20/20 205 lb (93 kg)  09/11/19 212 lb (96.2 kg)  08/14/19 209 lb 1.9 oz (94.9 kg)     GEN: \ Well nourished, well developed in no acute distress HEENT: Normal NECK: No JVD; No carotid bruits LYMPHATICS: No lymphadenopathy CARDIAC: RRR, no murmurs, rubs, gallops RESPIRATORY:  Clear to auscultation without rales, wheezing or rhonchi  ABDOMEN: Soft, non-tender, non-distended MUSCULOSKELETAL:  No edema; No deformity  SKIN: Warm and dry NEUROLOGIC:  Alert and oriented x 3 PSYCHIATRIC:  Normal affect    Signed, 10/12/19, MD  05/20/2020 8:58 AM    [facility]

## 2020-05-20 ENCOUNTER — Other Ambulatory Visit: Payer: Self-pay

## 2020-05-20 ENCOUNTER — Encounter: Payer: Self-pay | Admitting: Cardiology

## 2020-05-20 ENCOUNTER — Ambulatory Visit (INDEPENDENT_AMBULATORY_CARE_PROVIDER_SITE_OTHER): Payer: Self-pay | Admitting: Cardiology

## 2020-05-20 VITALS — BP 142/70 | HR 60 | Ht 74.0 in | Wt 205.0 lb

## 2020-05-20 DIAGNOSIS — I10 Essential (primary) hypertension: Secondary | ICD-10-CM

## 2020-05-20 DIAGNOSIS — R001 Bradycardia, unspecified: Secondary | ICD-10-CM

## 2020-05-20 MED ORDER — LOSARTAN POTASSIUM-HCTZ 50-12.5 MG PO TABS: 1.0000 | ORAL_TABLET | Freq: Every day | ORAL | 3 refills | Status: DC

## 2020-05-20 NOTE — Patient Instructions (Signed)
Medication Instructions:  Your physician has recommended you make the following change in your medication:  START: Hyzaar 50/12.5 mg take one tablet by mouth daily.  *If you need a refill on your cardiac medications before your next appointment, please call your pharmacy*   Lab Work: Your physician recommends that you return for lab work in: 2 weeks BMP If you have labs (blood work) drawn today and your tests are completely normal, you will receive your results only by: Marland Kitchen MyChart Message (if you have MyChart) OR . A paper copy in the mail If you have any lab test that is abnormal or we need to change your treatment, we will call you to review the results.   Testing/Procedures: None   Follow-Up: At [facility], you and your health needs are our priority.  As part of our continuing mission to provide you with exceptional heart care, we have created designated Provider Care Teams.  These Care Teams include your primary Cardiologist (physician) and Advanced Practice Providers (APPs -  Physician Assistants and Nurse Practitioners) who all work together to provide you with the care you need, when you need it.  We recommend signing up for the patient portal called "MyChart".  Sign up information is provided on this After Visit Summary.  MyChart is used to connect with patients for Virtual Visits (Telemedicine).  Patients are able to view lab/test results, encounter notes, upcoming appointments, etc.  Non-urgent messages can be sent to your provider as well.   To learn more about what you can do with MyChart, go to [email or web address].    Your next appointment:   6 month(s)  The format for your next appointment:   In Person  Provider:   Norman Herrlich, MD   Other Instructions

## 2020-09-06 ENCOUNTER — Other Ambulatory Visit: Payer: Self-pay | Admitting: Cardiology

## 2020-09-06 NOTE — Telephone Encounter (Signed)
Refill for Losartan HCTZ 50-12.5 mg to pharmacy

## 2020-09-29 ENCOUNTER — Other Ambulatory Visit: Payer: Self-pay | Admitting: Cardiology

## 2021-03-15 ENCOUNTER — Other Ambulatory Visit: Payer: Self-pay | Admitting: Cardiology

## 2021-03-17 NOTE — Telephone Encounter (Signed)
Losartan-Hydrochlorothiazide 50-12.5 mg # 90 only, with message to patient needs appointment for further refills, 1st attempt Sent to  [id number] - [place], [place] - [address]

## 2021-06-16 ENCOUNTER — Other Ambulatory Visit: Payer: Self-pay | Admitting: Cardiology

## 2021-06-16 NOTE — Telephone Encounter (Signed)
Losartan-HCTZ 50-12.5 MG # 30 tablets only, patient needs appointment for future refills . 2nd attempt Message sent with refill to   [id number] - [place], [place] - [address]

## 2021-07-01 ENCOUNTER — Other Ambulatory Visit: Payer: Self-pay

## 2021-07-01 MED ORDER — LOSARTAN POTASSIUM-HCTZ 50-12.5 MG PO TABS: 1.0000 | ORAL_TABLET | Freq: Every day | ORAL | 0 refills | Status: DC

## 2021-07-01 NOTE — Telephone Encounter (Signed)
Losartan-HCTZ 50-12.5 mg  # 15 only with message to patient and pharmacy [facility] [place] [place] needs appointment for future refills / final attempt

## 2021-07-28 ENCOUNTER — Other Ambulatory Visit: Payer: Self-pay

## 2021-07-28 MED ORDER — LOSARTAN POTASSIUM-HCTZ 50-12.5 MG PO TABS: 1.0000 | ORAL_TABLET | Freq: Every day | ORAL | 0 refills | Status: AC

## 2021-10-21 ENCOUNTER — Other Ambulatory Visit: Payer: Self-pay

## 2021-10-21 MED ORDER — AMLODIPINE BESYLATE 5 MG PO TABS: 5.0000 mg | ORAL_TABLET | Freq: Every day | ORAL | 0 refills | Status: DC

## 2021-10-31 ENCOUNTER — Other Ambulatory Visit: Payer: Self-pay | Admitting: Cardiology

## 2021-11-15 ENCOUNTER — Other Ambulatory Visit: Payer: Self-pay | Admitting: Cardiology

## 2021-11-30 ENCOUNTER — Other Ambulatory Visit: Payer: Self-pay | Admitting: Cardiology

## 2021-12-13 ENCOUNTER — Other Ambulatory Visit: Payer: Self-pay | Admitting: Cardiology

## 2021-12-15 NOTE — Telephone Encounter (Signed)
Rx refill sent to pharmacy. 

## 2021-12-29 ENCOUNTER — Other Ambulatory Visit: Payer: Self-pay | Admitting: Cardiology
